# Patient Record
Sex: Male | Born: 1995 | Race: Black or African American | Hispanic: No | Marital: Single | Smoking: Never smoker
Health system: Southern US, Community
[De-identification: ages and names within clinical notes are randomized; demographics above are authoritative.]

---

## 2015-01-26 ENCOUNTER — Emergency Department (HOSPITAL_COMMUNITY): Payer: Worker's Compensation

## 2015-01-26 ENCOUNTER — Encounter (HOSPITAL_COMMUNITY): Payer: Self-pay

## 2015-01-26 ENCOUNTER — Emergency Department (HOSPITAL_COMMUNITY)
Admission: EM | Admit: 2015-01-26 | Discharge: 2015-01-26 | Disposition: A | Discharge status: Home / Self Care | Payer: Worker's Compensation | Attending: Emergency Medicine | Admitting: Emergency Medicine

## 2015-01-26 DIAGNOSIS — W1789XA Other fall from one level to another, initial encounter: Secondary | ICD-10-CM | POA: Diagnosis not present

## 2015-01-26 DIAGNOSIS — S3992XA Unspecified injury of lower back, initial encounter: Secondary | ICD-10-CM | POA: Diagnosis present

## 2015-01-26 DIAGNOSIS — M545 Low back pain, unspecified: Secondary | ICD-10-CM

## 2015-01-26 DIAGNOSIS — Y9289 Other specified places as the place of occurrence of the external cause: Secondary | ICD-10-CM | POA: Diagnosis not present

## 2015-01-26 DIAGNOSIS — W174XXA Fall from dock, initial encounter: Secondary | ICD-10-CM

## 2015-01-26 DIAGNOSIS — Y9389 Activity, other specified: Secondary | ICD-10-CM | POA: Insufficient documentation

## 2015-01-26 DIAGNOSIS — Y998 Other external cause status: Secondary | ICD-10-CM | POA: Insufficient documentation

## 2015-01-26 MED ORDER — NAPROXEN 500 MG PO TABS: 500.0000 mg | ORAL_TABLET | Freq: Two times a day (BID) | ORAL | Status: AC

## 2015-01-26 NOTE — Discharge Instructions (Signed)

## 2015-01-26 NOTE — ED Notes (Signed)
Pt fell down about 4 steep steps today at 6pm when at work while lifting a couch and he landed on his back onto concrete and is having back pain. Denies head injury, no LOC.

## 2015-01-26 NOTE — ED Provider Notes (Signed)
CSN: [id number]     Arrival date & time 01/26/15  1843 History  By signing my name below, I, Allen Parker, attest that this documentation has been prepared under the direction and in the presence of Felicie Morn, NP. Electronically Signed: Gonzella Parker, Scribe. 01/26/2015. 8:17 PM.    Chief Complaint  Patient presents with  . Fall   Patient is a 19 y.o. male presenting with fall. The history is provided by the patient. No language interpreter was used.  Fall This is a new problem. The current episode started 1 to 2 hours ago. The problem occurs constantly. The problem has not changed since onset.Nothing aggravates the symptoms. Nothing relieves the symptoms. He has tried nothing for the symptoms.    HPI Comments: Allen Parker is a 19 y.o. male who presents to the Emergency Department complaining moderate, constant middle back pain after a 4-5 ft fall from a loading dock flat onto his back 1.5 hours ago.  There was brief tingling in the area of complaint that has since resolved.  Pt able to ambulate after incident with little to no difficulty.  He denies neck pain, hip pain, bowel/bladder incontinence.  He does not take any medications.  Pt has NKDA.  PCP: none History reviewed. No pertinent past medical history. History reviewed. No pertinent past surgical history. No family history on file. Social History  Substance Use Topics  . Smoking status: Never Smoker   . Smokeless tobacco: None  . Alcohol Use: No    Review of Systems  Musculoskeletal: Positive for back pain. Negative for neck pain.  All other systems reviewed and are negative.  Allergies  Review of patient's allergies indicates not on file.  Home Medications   Prior to Admission medications   Not on File   BP 143/83 mmHg  Pulse 88  Temp(Src) 97.4 F (36.3 C) (Oral)  Resp 16  Ht 5' 5.5" (1.664 m)  SpO2 98% Physical Exam  Constitutional: He is oriented to person, place, and time. He appears  well-developed and well-nourished. No distress.  HENT:  Head: Normocephalic.  Eyes: Conjunctivae are normal.  Cardiovascular: Normal rate.   Pulmonary/Chest: Effort normal.  Abdominal: He exhibits no distension.  Musculoskeletal: Normal range of motion. He exhibits no edema.  Neurological: He is alert and oriented to person, place, and time. He has normal strength. No sensory deficit.  Skin: Skin is warm and dry.  Psychiatric: He has a normal mood and affect.  Nursing note and vitals reviewed.   ED Course  Procedures  DIAGNOSTIC STUDIES:    Oxygen Saturation is 98% on room air, normal by my interpretation.   COORDINATION OF CARE:  7:58 PM Will order x-ray of back. Discussed treatment plan with pt at bedside and pt agreed to plan.   Labs Review Labs Reviewed - No data to display  Imaging Review Dg Lumbar Spine Complete  01/26/2015  CLINICAL DATA:  Pain following fall down steps EXAM: LUMBAR SPINE - COMPLETE 4+ VIEW COMPARISON:  None. FINDINGS: Frontal, lateral, spot lumbosacral lateral, and bilateral oblique views were obtained. There are 5 non-rib-bearing lumbar type vertebral bodies. There is no fracture or spondylolisthesis. Disc spaces appear intact. There is no appreciable facet arthropathy. IMPRESSION: No fracture or spondylolisthesis.  No appreciable arthropathy. Electronically Signed   By: Bretta Bang III M.D.   On: 01/26/2015 20:41   I have personally reviewed and evaluated these images and lab results as part of my medical decision-making.   EKG Interpretation  None     Radiology results reviewed and shared with patient. MDM   Final diagnoses:  None  Low back s/p fall from loading dock. Did not hit head of lose consciousness. No red flag symptoms.     I personally performed the services described in this documentation, which was scribed in my presence. The recorded information has been reviewed and is accurate.       Felicie Mornavid Michelina Mexicano, NP 01/26/15  [phone]  Loren Raceravid Yelverton, MD 01/30/15 [phone]

## 2017-06-10 IMAGING — DX DG LUMBAR SPINE COMPLETE 4+V
6 series · 6 of 6 positions shown · non-contrast
Comparison: None.

CLINICAL DATA: Pain following fall down steps

EXAM:
LUMBAR SPINE - COMPLETE 4+ VIEW

[l-spine ap]
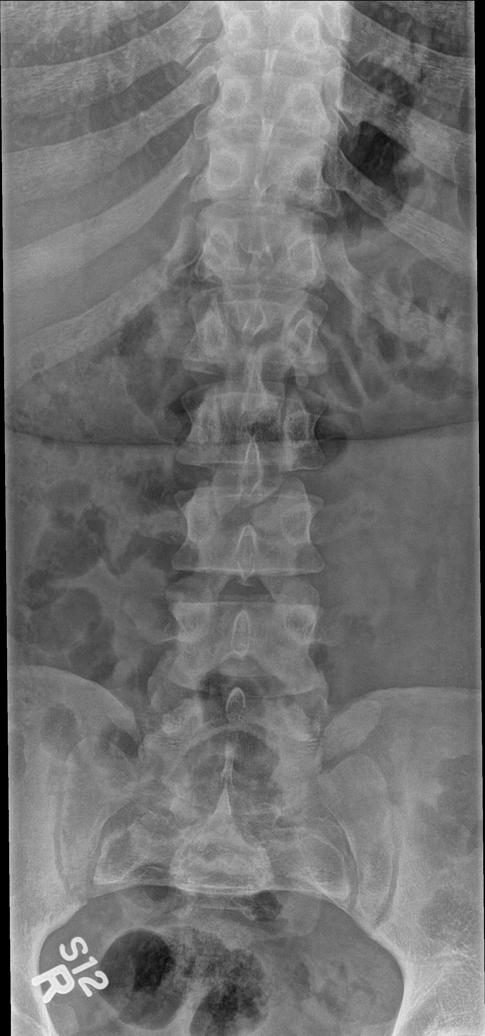

[l-spine obl (1 of 3)]
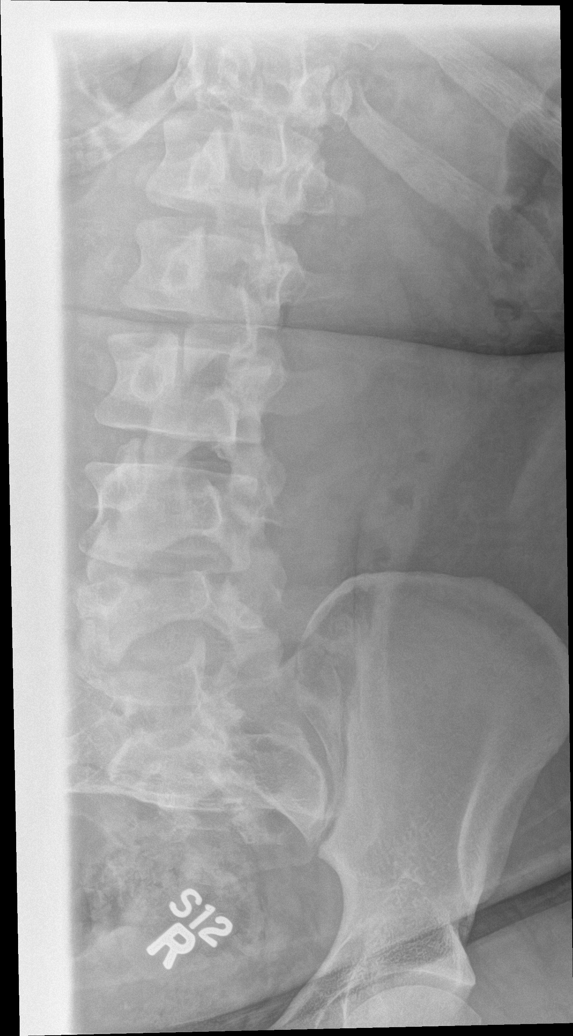

[l-spine obl (2 of 3)]
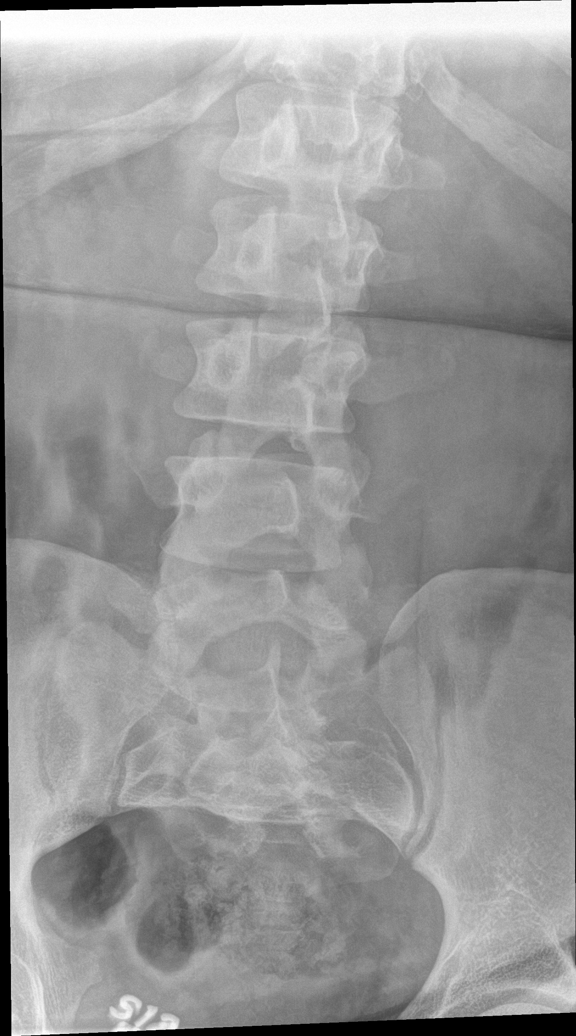

[l-spine lat]
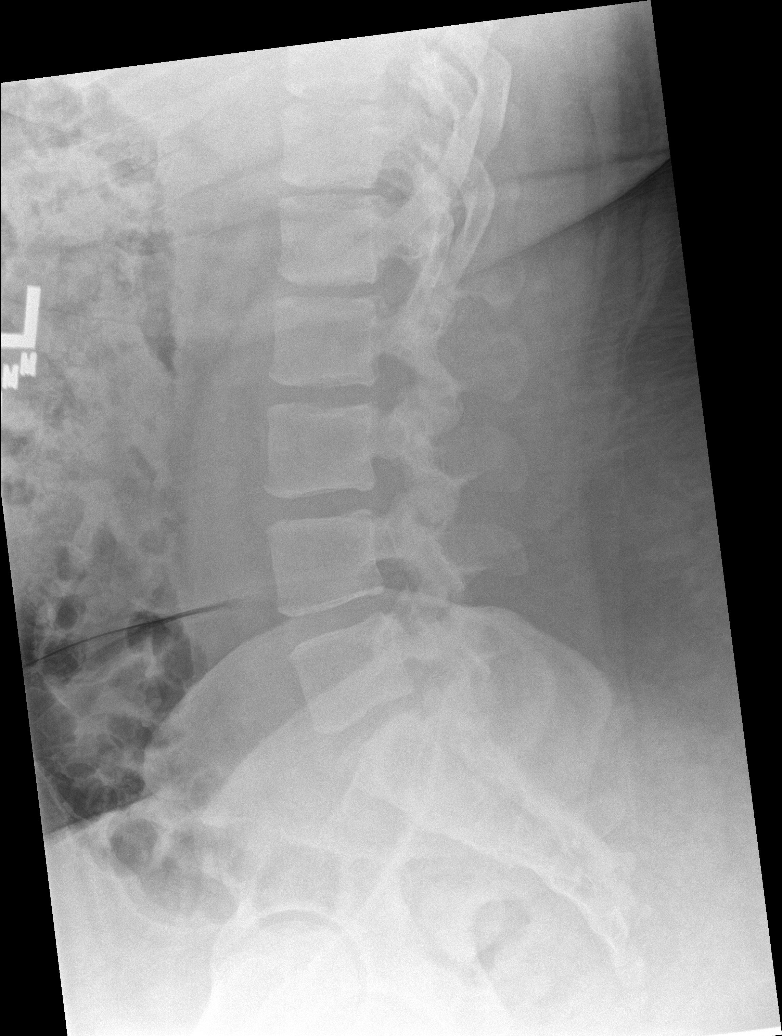

[l-spine spot]
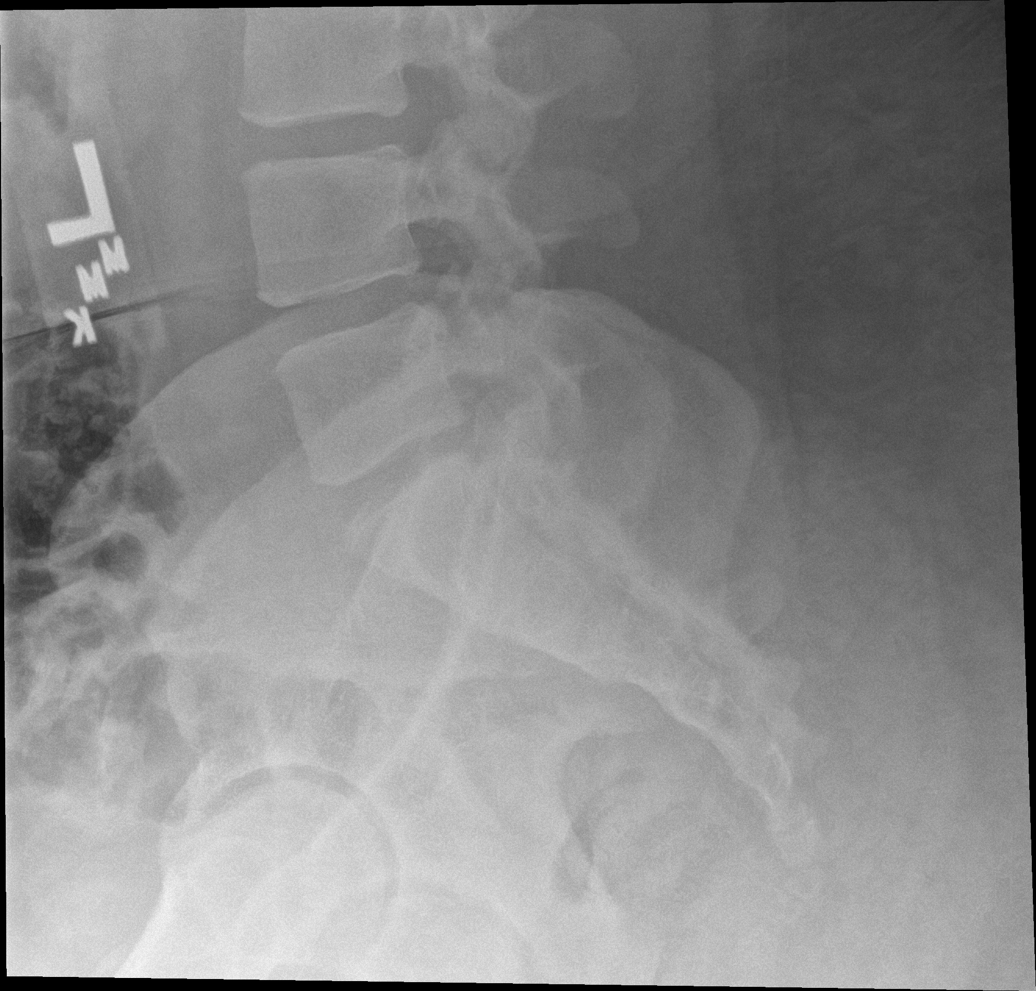

[l-spine obl (3 of 3)]
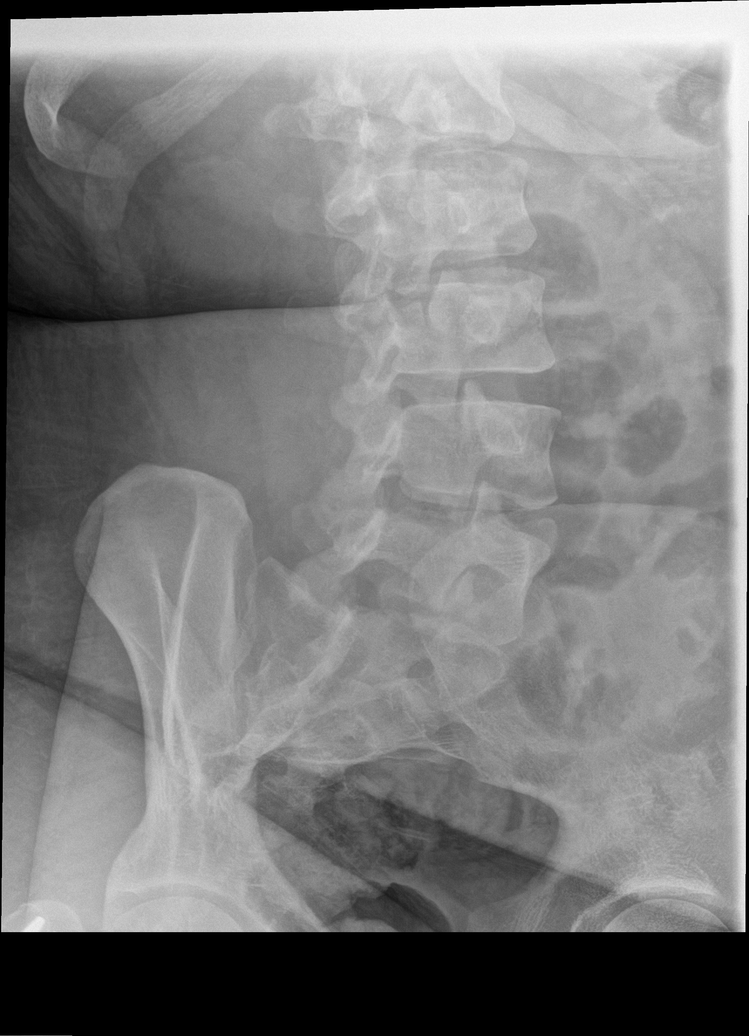

[6 of 6 positions shown; findings below may reference images not displayed]

FINDINGS: Frontal, lateral, spot lumbosacral lateral, and bilateral oblique
views were obtained. There are 5 non-rib-bearing lumbar type
vertebral bodies. There is no fracture or spondylolisthesis. Disc
spaces appear intact. There is no appreciable facet arthropathy.
IMPRESSION: No fracture or spondylolisthesis.  No appreciable arthropathy.
